# Patient Record
Sex: Female | Born: 1965 | Race: White | Hispanic: No | Marital: Single | Smoking: Never smoker
Health system: Southern US, Community
[De-identification: ages and names within clinical notes are randomized; demographics above are authoritative.]

## PROBLEM LIST (undated history)

## (undated) DIAGNOSIS — L509 Urticaria, unspecified: Secondary | ICD-10-CM

## (undated) DIAGNOSIS — D499 Neoplasm of unspecified behavior of unspecified site: Secondary | ICD-10-CM

## (undated) DIAGNOSIS — H269 Unspecified cataract: Secondary | ICD-10-CM

## (undated) HISTORY — PX: TYMPANOSTOMY TUBE PLACEMENT: SHX32

## (undated) HISTORY — PX: BREAST EXCISIONAL BIOPSY: SUR124

## (undated) HISTORY — PX: TUMOR REMOVAL: SHX12

## (undated) HISTORY — DX: Urticaria, unspecified: L50.9

## (undated) HISTORY — PX: TONSILLECTOMY: SUR1361

## (undated) HISTORY — DX: Unspecified cataract: H26.9

## (undated) HISTORY — DX: Neoplasm of unspecified behavior of unspecified site: D49.9

---

## 2000-06-29 ENCOUNTER — Encounter: Payer: Self-pay | Admitting: Family Medicine

## 2000-06-29 ENCOUNTER — Encounter: Admission: RE | Admit: 2000-06-29 | Discharge: 2000-06-29 | Payer: Self-pay | Admitting: Family Medicine

## 2000-12-25 ENCOUNTER — Encounter: Admission: RE | Admit: 2000-12-25 | Discharge: 2000-12-25 | Payer: Self-pay | Admitting: Family Medicine

## 2000-12-25 ENCOUNTER — Encounter: Payer: Self-pay | Admitting: Family Medicine

## 2001-06-24 ENCOUNTER — Encounter: Admission: RE | Admit: 2001-06-24 | Discharge: 2001-06-24 | Payer: Self-pay | Admitting: Family Medicine

## 2001-06-24 ENCOUNTER — Encounter: Payer: Self-pay | Admitting: Family Medicine

## 2001-12-11 ENCOUNTER — Encounter: Payer: Self-pay | Admitting: Family Medicine

## 2001-12-11 ENCOUNTER — Encounter: Admission: RE | Admit: 2001-12-11 | Discharge: 2001-12-11 | Payer: Self-pay | Admitting: Family Medicine

## 2002-05-20 ENCOUNTER — Encounter: Payer: Self-pay | Admitting: Family Medicine

## 2002-05-20 ENCOUNTER — Encounter: Admission: RE | Admit: 2002-05-20 | Discharge: 2002-05-20 | Payer: Self-pay | Admitting: Family Medicine

## 2007-05-06 ENCOUNTER — Encounter: Admission: RE | Admit: 2007-05-06 | Discharge: 2007-05-06 | Payer: Self-pay | Admitting: Family Medicine

## 2008-05-06 ENCOUNTER — Encounter: Admission: RE | Admit: 2008-05-06 | Discharge: 2008-05-06 | Payer: Self-pay | Admitting: Family Medicine

## 2008-05-15 ENCOUNTER — Encounter: Admission: RE | Admit: 2008-05-15 | Discharge: 2008-05-15 | Payer: Self-pay | Admitting: Family Medicine

## 2009-05-11 ENCOUNTER — Encounter: Admission: RE | Admit: 2009-05-11 | Discharge: 2009-05-11 | Payer: Self-pay | Admitting: Family Medicine

## 2010-05-13 ENCOUNTER — Encounter: Admission: RE | Admit: 2010-05-13 | Discharge: 2010-05-13 | Payer: Self-pay | Admitting: Family Medicine

## 2011-04-25 ENCOUNTER — Other Ambulatory Visit: Payer: Self-pay | Admitting: Family Medicine

## 2011-04-25 DIAGNOSIS — Z1231 Encounter for screening mammogram for malignant neoplasm of breast: Secondary | ICD-10-CM

## 2011-05-12 ENCOUNTER — Ambulatory Visit
Admission: RE | Admit: 2011-05-12 | Discharge: 2011-05-12 | Disposition: A | Discharge status: Home / Self Care | Payer: Medicaid Other | Source: Ambulatory Visit | Attending: Family Medicine | Admitting: Family Medicine

## 2011-05-12 DIAGNOSIS — Z1231 Encounter for screening mammogram for malignant neoplasm of breast: Secondary | ICD-10-CM

## 2012-04-30 ENCOUNTER — Other Ambulatory Visit: Payer: Self-pay | Admitting: Family Medicine

## 2012-04-30 DIAGNOSIS — Z1231 Encounter for screening mammogram for malignant neoplasm of breast: Secondary | ICD-10-CM

## 2012-05-29 ENCOUNTER — Ambulatory Visit
Admission: RE | Admit: 2012-05-29 | Discharge: 2012-05-29 | Disposition: A | Discharge status: Home / Self Care | Payer: Medicare Other | Source: Ambulatory Visit | Attending: Family Medicine | Admitting: Family Medicine

## 2012-05-29 DIAGNOSIS — Z1231 Encounter for screening mammogram for malignant neoplasm of breast: Secondary | ICD-10-CM

## 2012-07-17 HISTORY — PX: CATARACT EXTRACTION: SUR2

## 2013-04-28 ENCOUNTER — Other Ambulatory Visit: Payer: Self-pay

## 2013-04-28 DIAGNOSIS — Z1231 Encounter for screening mammogram for malignant neoplasm of breast: Secondary | ICD-10-CM

## 2013-05-30 ENCOUNTER — Ambulatory Visit
Admission: RE | Admit: 2013-05-30 | Discharge: 2013-05-30 | Disposition: A | Discharge status: Home / Self Care | Payer: Medicare Other | Source: Ambulatory Visit

## 2013-05-30 DIAGNOSIS — Z1231 Encounter for screening mammogram for malignant neoplasm of breast: Secondary | ICD-10-CM

## 2014-05-04 ENCOUNTER — Other Ambulatory Visit: Payer: Self-pay

## 2014-05-04 DIAGNOSIS — Z1231 Encounter for screening mammogram for malignant neoplasm of breast: Secondary | ICD-10-CM

## 2014-06-03 ENCOUNTER — Ambulatory Visit
Admission: RE | Admit: 2014-06-03 | Discharge: 2014-06-03 | Disposition: A | Discharge status: Home / Self Care | Payer: Medicare Other | Source: Ambulatory Visit

## 2014-06-03 DIAGNOSIS — Z1231 Encounter for screening mammogram for malignant neoplasm of breast: Secondary | ICD-10-CM

## 2015-05-26 ENCOUNTER — Other Ambulatory Visit: Payer: Self-pay

## 2015-05-26 DIAGNOSIS — Z1231 Encounter for screening mammogram for malignant neoplasm of breast: Secondary | ICD-10-CM

## 2015-06-30 ENCOUNTER — Ambulatory Visit
Admission: RE | Admit: 2015-06-30 | Discharge: 2015-06-30 | Disposition: A | Discharge status: Home / Self Care | Payer: Medicare Other | Source: Ambulatory Visit

## 2015-06-30 DIAGNOSIS — Z1231 Encounter for screening mammogram for malignant neoplasm of breast: Secondary | ICD-10-CM

## 2016-05-23 ENCOUNTER — Other Ambulatory Visit: Payer: Self-pay | Admitting: Family Medicine

## 2016-05-23 DIAGNOSIS — Z1231 Encounter for screening mammogram for malignant neoplasm of breast: Secondary | ICD-10-CM

## 2016-07-05 ENCOUNTER — Ambulatory Visit
Admission: RE | Admit: 2016-07-05 | Discharge: 2016-07-05 | Disposition: A | Discharge status: Home / Self Care | Payer: Medicare Other | Source: Ambulatory Visit | Attending: Family Medicine | Admitting: Family Medicine

## 2016-07-05 DIAGNOSIS — Z1231 Encounter for screening mammogram for malignant neoplasm of breast: Secondary | ICD-10-CM

## 2017-06-11 ENCOUNTER — Other Ambulatory Visit: Payer: Self-pay | Admitting: Family Medicine

## 2017-06-11 DIAGNOSIS — Z139 Encounter for screening, unspecified: Secondary | ICD-10-CM

## 2017-07-06 ENCOUNTER — Ambulatory Visit
Admission: RE | Admit: 2017-07-06 | Discharge: 2017-07-06 | Disposition: A | Discharge status: Home / Self Care | Payer: Medicare Other | Source: Ambulatory Visit | Attending: Family Medicine | Admitting: Family Medicine

## 2017-07-06 DIAGNOSIS — Z139 Encounter for screening, unspecified: Secondary | ICD-10-CM

## 2018-05-28 ENCOUNTER — Other Ambulatory Visit: Payer: Self-pay | Admitting: Family Medicine

## 2018-05-28 DIAGNOSIS — Z1231 Encounter for screening mammogram for malignant neoplasm of breast: Secondary | ICD-10-CM

## 2018-07-15 ENCOUNTER — Ambulatory Visit
Admission: RE | Admit: 2018-07-15 | Discharge: 2018-07-15 | Disposition: A | Discharge status: Home / Self Care | Payer: Medicare Other | Source: Ambulatory Visit | Attending: Family Medicine | Admitting: Family Medicine

## 2018-07-15 DIAGNOSIS — Z1231 Encounter for screening mammogram for malignant neoplasm of breast: Secondary | ICD-10-CM

## 2018-07-18 ENCOUNTER — Other Ambulatory Visit: Payer: Self-pay | Admitting: Family Medicine

## 2018-07-18 DIAGNOSIS — R928 Other abnormal and inconclusive findings on diagnostic imaging of breast: Secondary | ICD-10-CM

## 2018-07-22 ENCOUNTER — Ambulatory Visit
Admission: RE | Admit: 2018-07-22 | Discharge: 2018-07-22 | Disposition: A | Discharge status: Home / Self Care | Payer: Medicare Other | Source: Ambulatory Visit | Attending: Family Medicine | Admitting: Family Medicine

## 2018-07-22 ENCOUNTER — Other Ambulatory Visit: Payer: Self-pay | Admitting: Family Medicine

## 2018-07-22 DIAGNOSIS — R928 Other abnormal and inconclusive findings on diagnostic imaging of breast: Secondary | ICD-10-CM

## 2018-07-22 DIAGNOSIS — N6320 Unspecified lump in the left breast, unspecified quadrant: Secondary | ICD-10-CM

## 2018-07-24 ENCOUNTER — Ambulatory Visit
Admission: RE | Admit: 2018-07-24 | Discharge: 2018-07-24 | Disposition: A | Discharge status: Home / Self Care | Payer: Medicare Other | Source: Ambulatory Visit | Attending: Family Medicine | Admitting: Family Medicine

## 2018-07-24 ENCOUNTER — Other Ambulatory Visit: Payer: Self-pay | Admitting: Family Medicine

## 2018-07-24 DIAGNOSIS — N6320 Unspecified lump in the left breast, unspecified quadrant: Secondary | ICD-10-CM

## 2018-07-26 ENCOUNTER — Other Ambulatory Visit: Payer: Medicare Other

## 2018-12-18 ENCOUNTER — Other Ambulatory Visit: Payer: Self-pay | Admitting: Family Medicine

## 2018-12-18 DIAGNOSIS — N6489 Other specified disorders of breast: Secondary | ICD-10-CM

## 2019-02-10 ENCOUNTER — Other Ambulatory Visit: Payer: Self-pay

## 2019-02-10 ENCOUNTER — Other Ambulatory Visit: Payer: Self-pay | Admitting: Family Medicine

## 2019-02-10 ENCOUNTER — Ambulatory Visit: Payer: Medicare Other

## 2019-02-10 ENCOUNTER — Ambulatory Visit
Admission: RE | Admit: 2019-02-10 | Discharge: 2019-02-10 | Disposition: A | Discharge status: Home / Self Care | Payer: Medicare Other | Source: Ambulatory Visit | Attending: Family Medicine | Admitting: Family Medicine

## 2019-02-10 DIAGNOSIS — N6489 Other specified disorders of breast: Secondary | ICD-10-CM

## 2019-07-17 ENCOUNTER — Other Ambulatory Visit: Payer: Medicare Other

## 2019-07-21 ENCOUNTER — Other Ambulatory Visit: Payer: Self-pay

## 2019-07-21 ENCOUNTER — Ambulatory Visit: Payer: Medicare Other

## 2019-07-21 ENCOUNTER — Ambulatory Visit
Admission: RE | Admit: 2019-07-21 | Discharge: 2019-07-21 | Disposition: A | Discharge status: Home / Self Care | Payer: Medicare Other | Source: Ambulatory Visit | Attending: Family Medicine | Admitting: Family Medicine

## 2019-07-21 DIAGNOSIS — N6489 Other specified disorders of breast: Secondary | ICD-10-CM

## 2020-03-11 IMAGING — MG DIGITAL SCREENING BILATERAL MAMMOGRAM WITH TOMO AND CAD
8 series · 9 of 24 positions shown · non-contrast
Comparison: Previous exam(s).

CLINICAL DATA: Screening.

EXAM:
DIGITAL SCREENING BILATERAL MAMMOGRAM WITH TOMO AND CAD

[R MLO synth-2D]
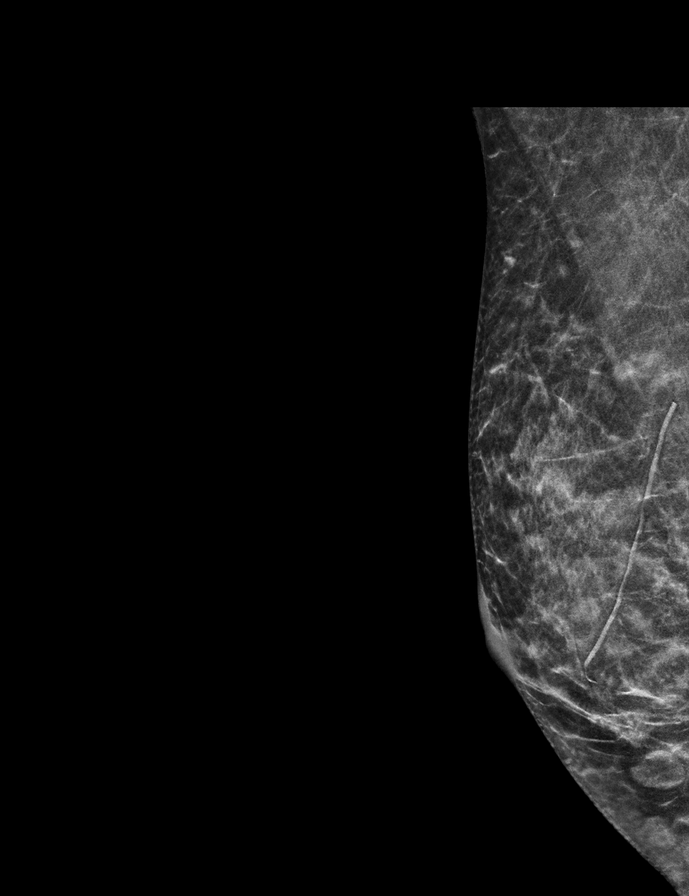

[L CC synth-2D]
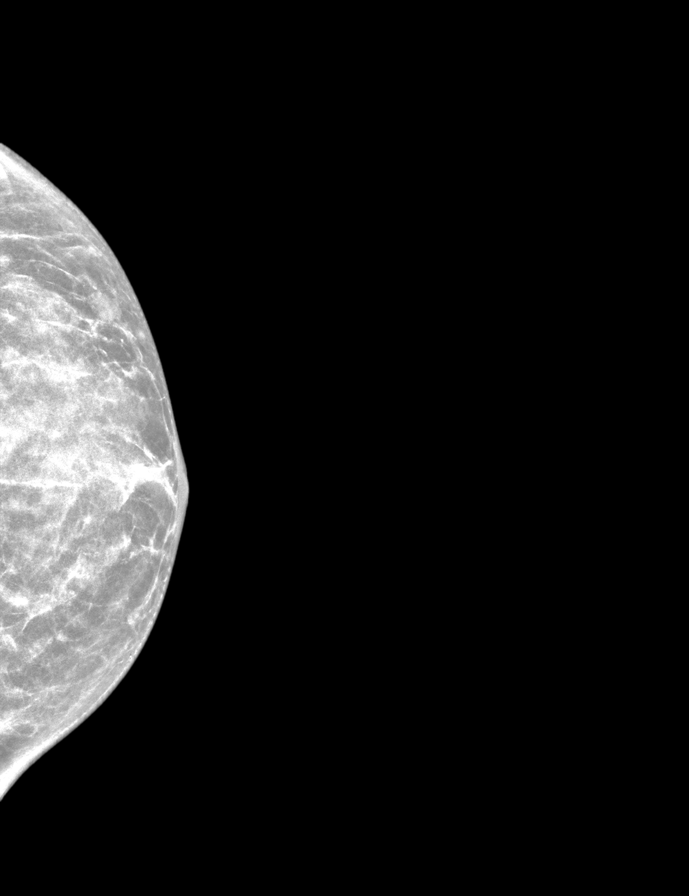

[R CC synth-2D]
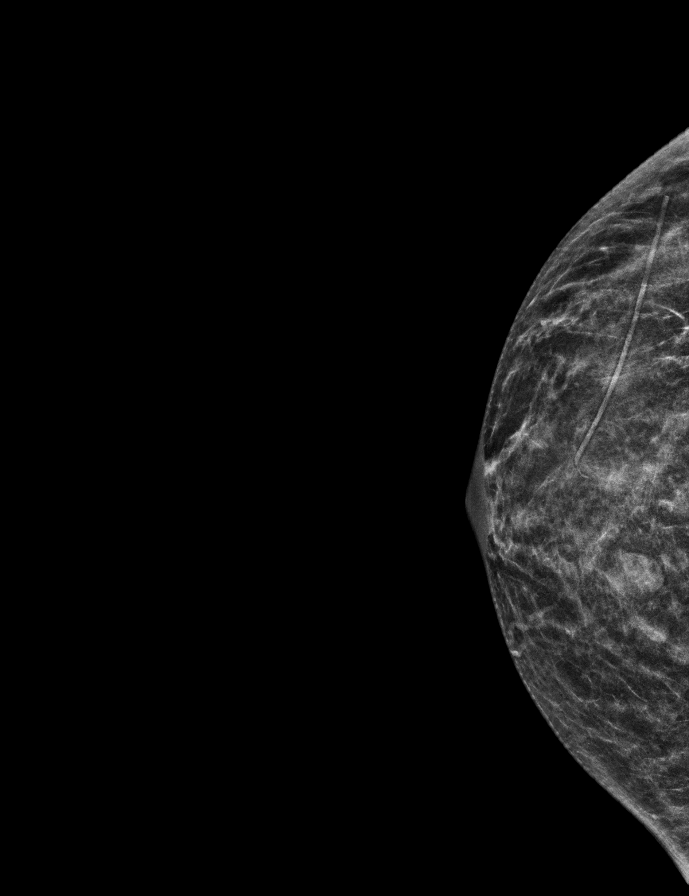

[L MLO synth-2D]
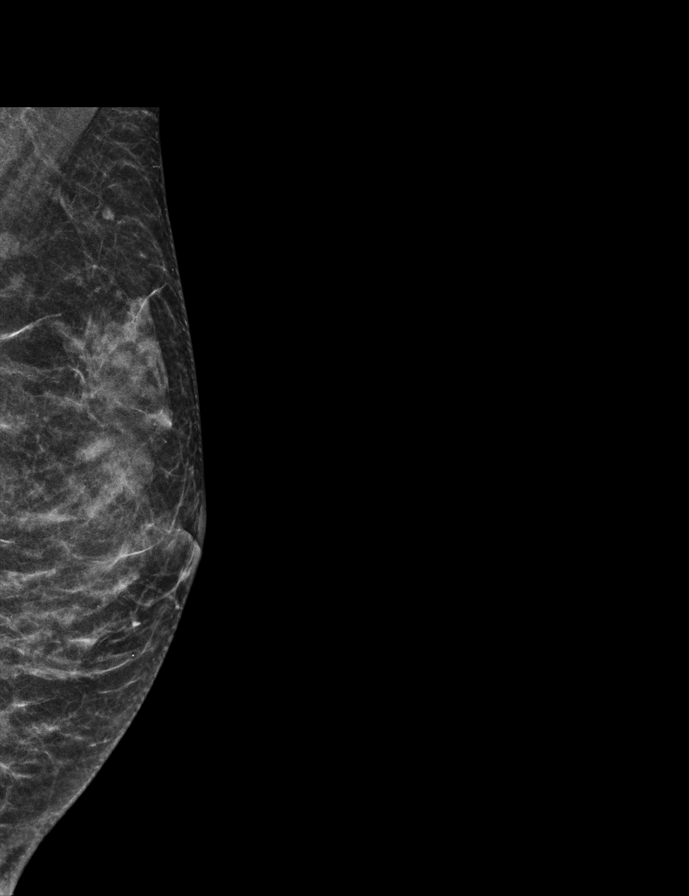

[R CC tomo · 2 of 40 frames shown]
[frame 13/40]
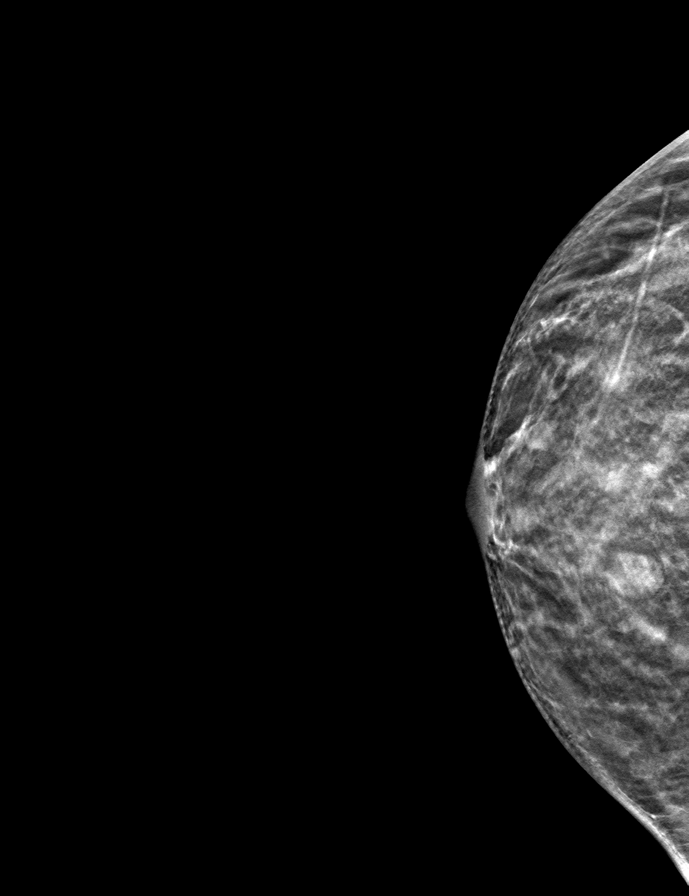
[frame 21/40]
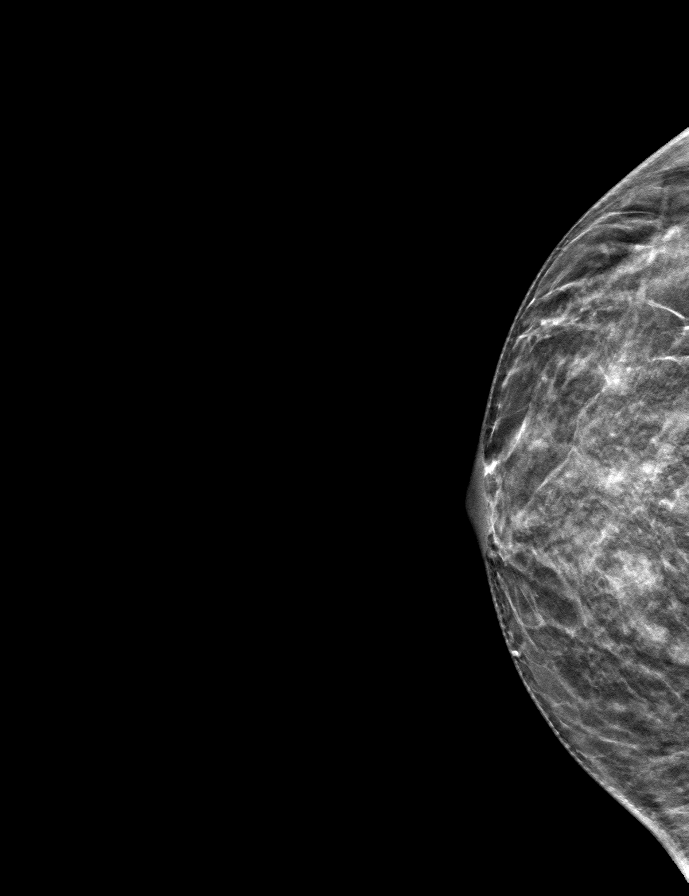

[R MLO tomo · tomo slice 23/45.0]
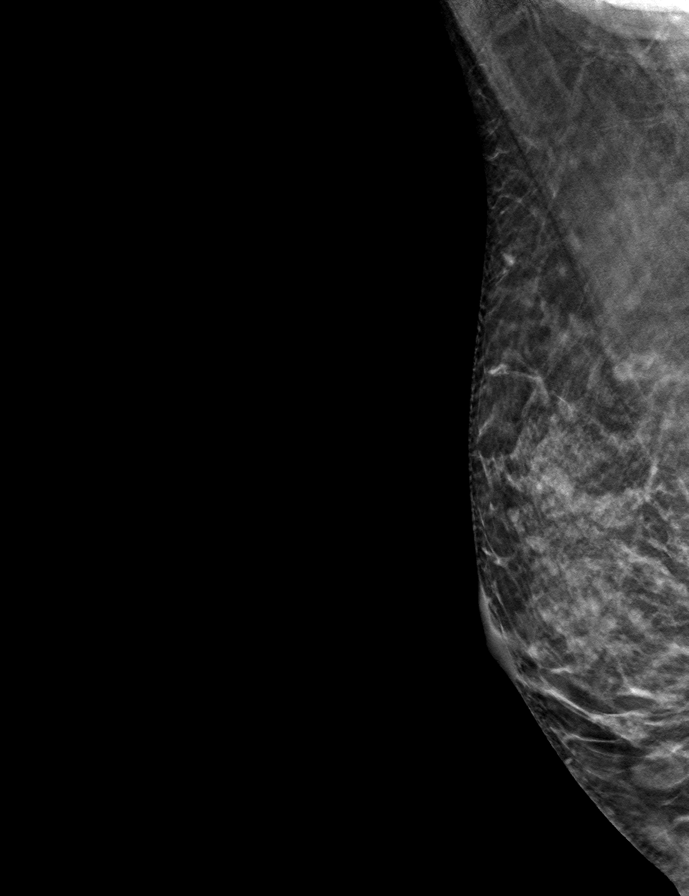

[L MLO tomo · tomo slice 20/39.0]
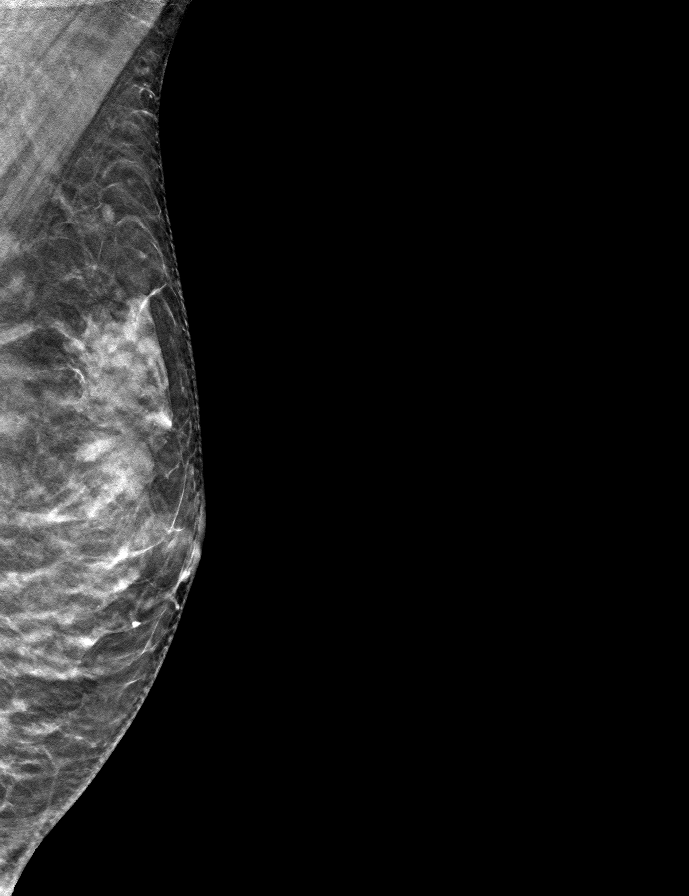

[L CC tomo · tomo slice 21/42.0]
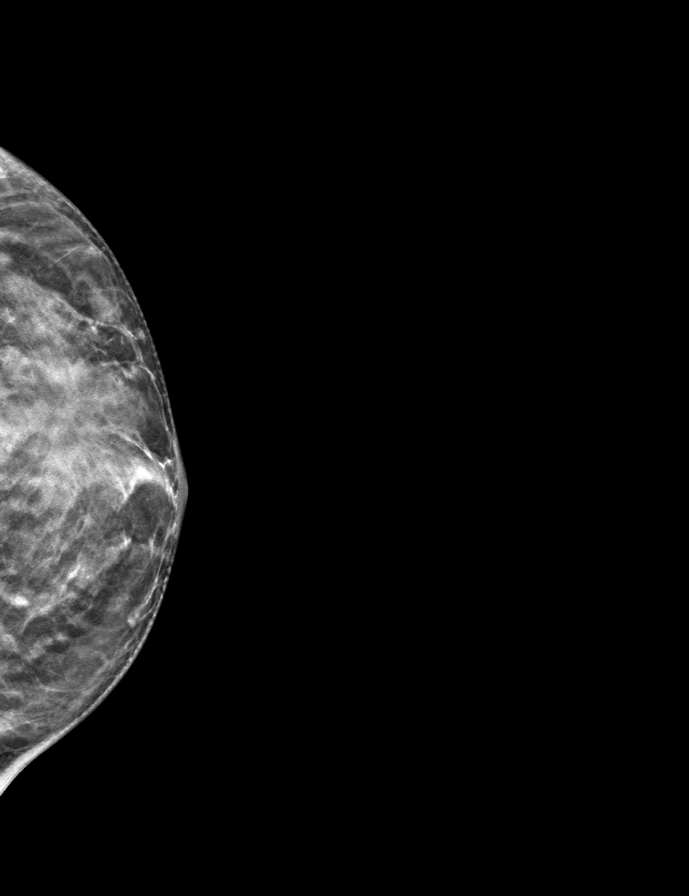

[9 of 24 positions shown; findings below may reference images not displayed]

ACR Breast Density Category c: The breast tissue is heterogeneously
dense, which may obscure small masses.
FINDINGS: In the left breast, possible distortion warrants further evaluation.
In the right breast, no findings suspicious for malignancy. Images
were processed with CAD.
IMPRESSION: Further evaluation is suggested for possible distortion in the left
breast.

RECOMMENDATION:
Diagnostic mammogram and possibly ultrasound of the left breast.
(Code:J9-G-HHO)

The patient will be contacted regarding the findings, and additional
imaging will be scheduled.

BI-RADS CATEGORY  0: Incomplete. Need additional imaging evaluation
and/or prior mammograms for comparison.

## 2020-06-21 ENCOUNTER — Other Ambulatory Visit: Payer: Self-pay | Admitting: Family Medicine

## 2020-06-21 DIAGNOSIS — N6012 Diffuse cystic mastopathy of left breast: Secondary | ICD-10-CM

## 2020-09-14 ENCOUNTER — Ambulatory Visit
Admission: RE | Admit: 2020-09-14 | Discharge: 2020-09-14 | Disposition: A | Discharge status: Home / Self Care | Payer: Medicare Other | Source: Ambulatory Visit | Attending: Family Medicine | Admitting: Family Medicine

## 2020-09-14 ENCOUNTER — Other Ambulatory Visit: Payer: Self-pay

## 2020-09-14 DIAGNOSIS — N6012 Diffuse cystic mastopathy of left breast: Secondary | ICD-10-CM

## 2021-08-15 ENCOUNTER — Other Ambulatory Visit: Payer: Self-pay | Admitting: Family Medicine

## 2021-08-15 DIAGNOSIS — Z9889 Other specified postprocedural states: Secondary | ICD-10-CM

## 2021-09-16 ENCOUNTER — Other Ambulatory Visit: Payer: Self-pay

## 2021-09-16 ENCOUNTER — Ambulatory Visit
Admission: RE | Admit: 2021-09-16 | Discharge: 2021-09-16 | Disposition: A | Discharge status: Home / Self Care | Payer: Medicare Other | Source: Ambulatory Visit | Attending: Family Medicine | Admitting: Family Medicine

## 2021-09-16 ENCOUNTER — Other Ambulatory Visit: Payer: Self-pay | Admitting: Family Medicine

## 2021-09-16 DIAGNOSIS — Z9889 Other specified postprocedural states: Secondary | ICD-10-CM

## 2021-09-16 DIAGNOSIS — N6489 Other specified disorders of breast: Secondary | ICD-10-CM

## 2021-10-12 ENCOUNTER — Ambulatory Visit (INDEPENDENT_AMBULATORY_CARE_PROVIDER_SITE_OTHER): Payer: Medicare Other | Admitting: Allergy and Immunology

## 2021-10-12 ENCOUNTER — Encounter: Payer: Self-pay | Admitting: Allergy and Immunology

## 2021-10-12 VITALS — BP 142/82 | HR 64 | Resp 14 | Ht 70.5 in | Wt 149.2 lb

## 2021-10-12 DIAGNOSIS — L500 Allergic urticaria: Secondary | ICD-10-CM

## 2021-10-12 DIAGNOSIS — T7840XA Allergy, unspecified, initial encounter: Secondary | ICD-10-CM | POA: Diagnosis not present

## 2021-10-12 DIAGNOSIS — T7840XD Allergy, unspecified, subsequent encounter: Secondary | ICD-10-CM

## 2021-10-12 MED ORDER — CETIRIZINE HCL 10 MG PO TABS: ORAL_TABLET | ORAL | 5 refills | Status: AC

## 2021-10-12 MED ORDER — MONTELUKAST SODIUM 10 MG PO TABS: 10.0000 mg | ORAL_TABLET | Freq: Every day | ORAL | 5 refills | Status: AC

## 2021-10-12 NOTE — Patient Instructions (Addendum)
?  1.  Allergen avoidance measures??? ? ?2.  Blood - CBC w/d, CMP, TSH, FT4, Thyroid peroxidase, Alpha-gal panel ? ?3. Every day: ? ?A. Cetirizine 10 mg - 2 tablets 2 times per day ?B. Montelukast 10 mg - 1 tablet 1 time per day ? ?4. Prednisone 10 mg - 1 tablet 1 time per day for 10 days only ? ?5. Return to clinic in 4 weeks or earlier if needed ?

## 2021-10-12 NOTE — Progress Notes (Signed)
?[place] ? ? ?Dear Dr. Arelia Sneddon, ? ?Thank you for referring Kayla Gardner to the [facility] on 10/12/2021.  ? ?Below is a summation of this patient's evaluation and recommendations. ? ?Thank you for your referral. I will keep you informed about this patient's response to treatment.  ? ?If you have any questions please do not hesitate to contact me.  ? ?Sincerely, ? ?Jiles Prows, MD ?Allergy / Immunology ?[facility] ? ? ?______________________________________________________________________ ? ? ? ?NEW PATIENT NOTE ? ?Referring Provider: Leonard Downing, * ?Primary Provider: Leonard Downing, MD ?Date of office visit: 10/12/2021 ?   ?Subjective:  ? ?Chief Complaint:  Kayla Gardner (DOB: 10/12/1965) is a 56 y.o. female who presents to the clinic on 10/12/2021 with a chief complaint of Urticaria ?.    ? ?HPI: Kayla Gardner presents to this clinic in evaluation of hives. ? ?For approximately 3 months she has been having red raised itchy lesions across her body that appeared to last less than 24 hours that never leave behind a scar or pigment and are not associated with any systemic or constitutional symptoms and did not have any obvious trigger.  She has not started any new medications, new over-the-counter supplements, had a significant environmental change, had a significant change in what she eats, and has no symptoms to suggest an ongoing infectious disease. ? ?Treatment has included multiple antihistamines and what sounds like famotidine.  She received a "shot" from [facility] dermatology in March and her hives did go away for 1 week but have since returned. ? ?She does not have a atopic history in general. ? ?Past Medical History:  ?Diagnosis Date  ? Cataract   ? Tumors   ? Benign  ? Urticaria   ? ? ?Past Surgical History:  ?Procedure Laterality Date  ? BREAST  EXCISIONAL BIOPSY Right   ? CATARACT EXTRACTION Right 2014  ? TONSILLECTOMY    ? TUMOR REMOVAL    ? TYMPANOSTOMY TUBE PLACEMENT    ? ? ?Allergies as of 10/12/2021   ?No Known Allergies ?  ? ?  ?Medication List  ? ? ?BENADRYL PO ?Take 1 capsule by mouth daily. ?  ?dorzolamide-timolol 22.3-6.8 MG/ML ophthalmic solution ?Commonly known as: COSOPT ?SMARTSIG:In Eye(s) ?  ?fexofenadine 180 MG tablet ?Commonly known as: ALLEGRA ?Take 360 mg by mouth daily. ?  ?latanoprost 0.005 % ophthalmic solution ?Commonly known as: XALATAN ?SMARTSIG:In Eye(s) ?  ?LUTEIN PO ?Take by mouth daily. ?  ?TYLENOL PO ?Take by mouth as needed. ?  ? ?Review of systems negative except as noted in HPI / PMHx or noted below: ? ?Review of Systems  ?Constitutional: Negative.   ?HENT: Negative.    ?Eyes: Negative.   ?Respiratory: Negative.    ?Cardiovascular: Negative.   ?Gastrointestinal: Negative.   ?Genitourinary: Negative.   ?Musculoskeletal: Negative.   ?Skin: Negative.   ?Neurological: Negative.   ?Endo/Heme/Allergies: Negative.   ?Psychiatric/Behavioral: Negative.    ? ?Family History  ?Problem Relation Age of Onset  ? Heart attack Father   ? Other Maternal Grandmother   ?     Brain tumor  ? Cancer Paternal Grandmother   ? ? ?Social History  ? ?Socioeconomic History  ? Marital status: Single  ?  Spouse name: Not on file  ? Number of children: Not on file  ? Years of education: Not on file  ?  Highest education level: Not on file  ?Occupational History  ? Not on file  ?Tobacco Use  ? Smoking status: Never  ? Smokeless tobacco: Never  ?Substance and Sexual Activity  ? Alcohol use: Never  ? Drug use: Never  ? Sexual activity: Not on file  ?Other Topics Concern  ? Not on file  ?Social History Narrative  ? Not on file  ? ?Environmental and Social history ? ?Lives in a house with a dry environment, a dog located inside the household, no carpet in the bedroom, plastic on the bed, plastic on the pillow, and no smoking ongoing with inside the  household. ? ?Objective:  ? ?Vitals:  ? 10/12/21 1351  ?BP: (!) 142/82  ?Pulse: 64  ?Resp: 14  ?SpO2: 98%  ? ?Height: 5' 10.5" (179.1 cm) ?Weight: 149 lb 3.2 oz (67.7 kg) ? ?Physical Exam ?Constitutional:   ?   Appearance: She is not diaphoretic.  ?HENT:  ?   Head: Normocephalic. No right periorbital erythema or left periorbital erythema.  ?   Right Ear: Tympanic membrane, ear canal and external ear normal.  ?   Left Ear: Tympanic membrane, ear canal and external ear normal.  ?   Nose: Nose normal. No mucosal edema or rhinorrhea.  ?   Mouth/Throat:  ?   Pharynx: Uvula midline. No oropharyngeal exudate.  ?Eyes:  ?   General: Lids are normal.  ?   Conjunctiva/sclera: Conjunctivae normal.  ?   Pupils: Pupils are equal, round, and reactive to light.  ?Neck:  ?   Thyroid: No thyromegaly.  ?   Trachea: Trachea normal. No tracheal tenderness or tracheal deviation.  ?Cardiovascular:  ?   Rate and Rhythm: Normal rate and regular rhythm.  ?   Heart sounds: Normal heart sounds, S1 normal and S2 normal. No murmur heard. ?Pulmonary:  ?   Effort: Pulmonary effort is normal. No respiratory distress.  ?   Breath sounds: Normal breath sounds. No stridor. No wheezing or rales.  ?Chest:  ?   Chest wall: No tenderness.  ?Abdominal:  ?   General: There is no distension.  ?   Palpations: Abdomen is soft. There is no mass.  ?   Tenderness: There is no abdominal tenderness. There is no guarding or rebound.  ?Musculoskeletal:     ?   General: No tenderness.  ?Lymphadenopathy:  ?   Head:  ?   Right side of head: No tonsillar adenopathy.  ?   Left side of head: No tonsillar adenopathy.  ?   Cervical: No cervical adenopathy.  ?Skin: ?   Coloration: Skin is not pale.  ?   Findings: Rash (Blanching urticarial lesions hips) present. No erythema.  ?   Nails: There is no clubbing.  ?Neurological:  ?   Mental Status: She is alert.  ? ? ?Diagnostics: Allergy skin tests were performed.  She did not demonstrate any hypersensitivity against a screening  panel of foods. ? ?Assessment and Plan:  ? ? ?1. Allergic urticaria   ?2. Allergic reaction, subsequent encounter   ? ? ?1.  Allergen avoidance measures??? ? ?2.  Blood - CBC w/d, CMP, TSH, FT4, Thyroid peroxidase, Alpha-gal panel ? ?3. Every day: ? ?A. Cetirizine 10 mg - 2 tablets 2 times per day ?B. Montelukast 10 mg - 1 tablet 1 time per day ? ?4. Prednisone 10 mg - 1 tablet 1 time per day for 10 days only ? ?5. Return to clinic in 4 weeks or earlier if  needed ? 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  We will screen her blood for worrisome systemic disease contributing to her overactive immune system as noted above.  She will use a combination of high-dose cetirizine and montelukast on a chronic basis and I have given her a short course of systemic steroids during today's visit.  I will regroup with her in 4 weeks or earlier if there is a problem. ? ?Jiles Prows, MD ?Allergy / Immunology ?[facility] ? ?

## 2021-10-13 ENCOUNTER — Encounter: Payer: Self-pay | Admitting: Allergy and Immunology

## 2021-10-13 ENCOUNTER — Telehealth: Payer: Self-pay | Admitting: Allergy and Immunology

## 2021-10-13 NOTE — Telephone Encounter (Signed)
Patients mom states the pharmacist mentioned the dosage for her cetirizine seemed too high. Mom wanted to double check that it was okay for patient to take 2 '10mg'$  tablets twice a day.  ?

## 2021-10-13 NOTE — Telephone Encounter (Signed)
Mom informed that it is safe for Kayla Gardner to take the dose instructed by Dr. Neldon Mc.  ?

## 2021-10-15 LAB — COMPREHENSIVE METABOLIC PANEL
ALT: 19 IU/L (ref 0–32)
AST: 20 IU/L (ref 0–40)
Albumin/Globulin Ratio: 2.5 — ABNORMAL HIGH (ref 1.2–2.2)
Albumin: 4.7 g/dL (ref 3.8–4.9)
Alkaline Phosphatase: 68 IU/L (ref 44–121)
BUN/Creatinine Ratio: 18 (ref 9–23)
BUN: 9 mg/dL (ref 6–24)
Bilirubin Total: 0.5 mg/dL (ref 0.0–1.2)
CO2: 26 mmol/L (ref 20–29)
Calcium: 9.5 mg/dL (ref 8.7–10.2)
Chloride: 104 mmol/L (ref 96–106)
Creatinine, Ser: 0.5 mg/dL — ABNORMAL LOW (ref 0.57–1.00)
Globulin, Total: 1.9 g/dL (ref 1.5–4.5)
Glucose: 98 mg/dL (ref 70–99)
Potassium: 4.2 mmol/L (ref 3.5–5.2)
Sodium: 142 mmol/L (ref 134–144)
Total Protein: 6.6 g/dL (ref 6.0–8.5)
eGFR: 111 mL/min/{1.73_m2} (ref >59)

## 2021-10-15 LAB — CBC WITH DIFFERENTIAL/PLATELET
Basophils Absolute: 0 10*3/uL (ref 0.0–0.2)
Basos: 0 %
EOS (ABSOLUTE): 0 10*3/uL (ref 0.0–0.4)
Eos: 0 %
Hematocrit: 40.3 % (ref 34.0–46.6)
Hemoglobin: 13.5 g/dL (ref 11.1–15.9)
Immature Grans (Abs): 0 10*3/uL (ref 0.0–0.1)
Immature Granulocytes: 0 %
Lymphocytes Absolute: 1.8 10*3/uL (ref 0.7–3.1)
Lymphs: 28 %
MCH: 31.2 pg (ref 26.6–33.0)
MCHC: 33.5 g/dL (ref 31.5–35.7)
MCV: 93 fL (ref 79–97)
Monocytes Absolute: 0.5 10*3/uL (ref 0.1–0.9)
Monocytes: 8 %
Neutrophils Absolute: 4.1 10*3/uL (ref 1.4–7.0)
Neutrophils: 64 %
Platelets: 197 10*3/uL (ref 150–450)
RBC: 4.33 x10E6/uL (ref 3.77–5.28)
RDW: 12 % (ref 11.7–15.4)
WBC: 6.5 10*3/uL (ref 3.4–10.8)

## 2021-10-15 LAB — TSH+FREE T4
Free T4: 1.21 ng/dL (ref 0.82–1.77)
TSH: 3.2 u[IU]/mL (ref 0.450–4.500)

## 2021-10-15 LAB — THYROID PEROXIDASE ANTIBODY: Thyroperoxidase Ab SerPl-aCnc: 101 IU/mL — ABNORMAL HIGH (ref 0–34)

## 2021-10-15 LAB — ALPHA-GAL PANEL
Allergen Lamb IgE: <0.1 kU/L
Beef IgE: <0.1 kU/L
IgE (Immunoglobulin E), Serum: 13 IU/mL (ref 6–495)
O215-IgE Alpha-Gal: <0.1 kU/L
Pork IgE: <0.1 kU/L

## 2021-11-16 ENCOUNTER — Ambulatory Visit: Payer: Medicare Other | Admitting: Allergy and Immunology

## 2022-08-11 ENCOUNTER — Other Ambulatory Visit: Payer: Self-pay | Admitting: Family Medicine

## 2022-08-11 DIAGNOSIS — Z1231 Encounter for screening mammogram for malignant neoplasm of breast: Secondary | ICD-10-CM

## 2022-08-13 LAB — COLOGUARD: COLOGUARD: NEGATIVE

## 2022-08-13 LAB — EXTERNAL GENERIC LAB PROCEDURE: COLOGUARD: NEGATIVE

## 2022-09-27 ENCOUNTER — Ambulatory Visit: Payer: Medicare Other

## 2022-11-09 ENCOUNTER — Ambulatory Visit
Admission: RE | Admit: 2022-11-09 | Discharge: 2022-11-09 | Disposition: A | Discharge status: Home / Self Care | Payer: 59 | Source: Ambulatory Visit | Attending: Family Medicine | Admitting: Family Medicine

## 2022-11-09 DIAGNOSIS — Z1231 Encounter for screening mammogram for malignant neoplasm of breast: Secondary | ICD-10-CM

## 2023-10-02 ENCOUNTER — Other Ambulatory Visit: Payer: Self-pay | Admitting: Family Medicine

## 2023-10-02 DIAGNOSIS — Z1231 Encounter for screening mammogram for malignant neoplasm of breast: Secondary | ICD-10-CM

## 2023-11-12 ENCOUNTER — Ambulatory Visit
Admission: RE | Admit: 2023-11-12 | Discharge: 2023-11-12 | Disposition: A | Discharge status: Home / Self Care | Source: Ambulatory Visit | Attending: Family Medicine | Admitting: Family Medicine

## 2023-11-12 DIAGNOSIS — Z1231 Encounter for screening mammogram for malignant neoplasm of breast: Secondary | ICD-10-CM

## 2024-05-09 ENCOUNTER — Encounter: Payer: Self-pay | Admitting: Nurse Practitioner
